# Patient Record
Sex: Female | Born: 1985
Health system: Southern US, Community
[De-identification: ages and names within clinical notes are randomized; demographics above are authoritative.]

## PROBLEM LIST (undated history)

## (undated) DIAGNOSIS — F419 Anxiety disorder, unspecified: Secondary | ICD-10-CM

## (undated) DIAGNOSIS — F319 Bipolar disorder, unspecified: Secondary | ICD-10-CM

## (undated) DIAGNOSIS — F329 Major depressive disorder, single episode, unspecified: Secondary | ICD-10-CM

## (undated) DIAGNOSIS — K859 Acute pancreatitis without necrosis or infection, unspecified: Secondary | ICD-10-CM

## (undated) DIAGNOSIS — F32A Depression, unspecified: Secondary | ICD-10-CM

## (undated) DIAGNOSIS — F1990 Other psychoactive substance use, unspecified, uncomplicated: Secondary | ICD-10-CM

## (undated) HISTORY — PX: BREAST SURGERY: SHX581

---

## 2017-03-08 ENCOUNTER — Encounter (HOSPITAL_COMMUNITY): Payer: Self-pay | Admitting: Emergency Medicine

## 2017-03-08 ENCOUNTER — Emergency Department (HOSPITAL_COMMUNITY)
Admission: EM | Admit: 2017-03-08 | Discharge: 2017-03-08 | Disposition: A | Discharge status: Home / Self Care | Payer: Medicare Other | Attending: Emergency Medicine | Admitting: Emergency Medicine

## 2017-03-08 DIAGNOSIS — F17200 Nicotine dependence, unspecified, uncomplicated: Secondary | ICD-10-CM | POA: Diagnosis not present

## 2017-03-08 DIAGNOSIS — Y9389 Activity, other specified: Secondary | ICD-10-CM | POA: Diagnosis not present

## 2017-03-08 DIAGNOSIS — S3992XA Unspecified injury of lower back, initial encounter: Secondary | ICD-10-CM | POA: Diagnosis present

## 2017-03-08 DIAGNOSIS — M545 Low back pain, unspecified: Secondary | ICD-10-CM

## 2017-03-08 DIAGNOSIS — Y999 Unspecified external cause status: Secondary | ICD-10-CM | POA: Insufficient documentation

## 2017-03-08 DIAGNOSIS — X509XXA Other and unspecified overexertion or strenuous movements or postures, initial encounter: Secondary | ICD-10-CM | POA: Diagnosis not present

## 2017-03-08 DIAGNOSIS — Y9289 Other specified places as the place of occurrence of the external cause: Secondary | ICD-10-CM | POA: Diagnosis not present

## 2017-03-08 LAB — URINALYSIS, ROUTINE W REFLEX MICROSCOPIC
Bacteria, UA: NONE SEEN
Bilirubin Urine: NEGATIVE
GLUCOSE, UA: NEGATIVE mg/dL
KETONES UR: NEGATIVE mg/dL
LEUKOCYTES UA: NEGATIVE
Nitrite: NEGATIVE
PH: 6 (ref 5.0–8.0)
Protein, ur: NEGATIVE mg/dL
Specific Gravity, Urine: 1 — ABNORMAL LOW (ref 1.005–1.030)

## 2017-03-08 LAB — POC URINE PREG, ED: Preg Test, Ur: NEGATIVE

## 2017-03-08 MED ORDER — METHOCARBAMOL 500 MG PO TABS
500.0000 mg | ORAL_TABLET | Freq: Once | ORAL | Status: AC
  Administered 2017-03-08: 500 mg via ORAL
  Filled 2017-03-08: qty 1

## 2017-03-08 MED ORDER — KETOROLAC TROMETHAMINE 60 MG/2ML IM SOLN
30.0000 mg | Freq: Once | INTRAMUSCULAR | Status: AC
  Administered 2017-03-08: 30 mg via INTRAMUSCULAR
  Filled 2017-03-08: qty 2

## 2017-03-08 MED ORDER — METHOCARBAMOL 500 MG PO TABS: 500.0000 mg | ORAL_TABLET | Freq: Two times a day (BID) | ORAL | 0 refills | Status: DC

## 2017-03-08 MED ORDER — DICLOFENAC SODIUM 75 MG PO TBEC: 75.0000 mg | DELAYED_RELEASE_TABLET | Freq: Two times a day (BID) | ORAL | 0 refills | Status: DC

## 2017-03-08 NOTE — ED Triage Notes (Signed)
p[t. Stated, I took out the trash yesterday at work and Im not sure if that caused it , but this morning I couildn't hardly get out of bed.

## 2017-03-08 NOTE — ED Notes (Signed)
Pt states "I just got my period".

## 2017-03-08 NOTE — ED Notes (Signed)
Pt informed that urine specimen is needed.  

## 2017-03-08 NOTE — ED Notes (Addendum)
Pt states she was emptying trash last pm, had sudden left lower back pain. Woke this am and "I couldn't walk". States has never had this problem before. Pt moaning with pain. Denies vaginal discharge.

## 2017-03-08 NOTE — ED Provider Notes (Signed)
[facility]-EMERGENCY DEPT Provider Note   CSN: [id number] Arrival date & time: 03/08/17  7829  By signing my name below, I, Majel Homer, attest that this documentation has been prepared under the direction and in the presence of non-physician practitioner, Ok Edwards, PA-C. Electronically Signed: Majel Homer, Scribe. 03/08/2017. 10:35 AM.  History   Chief Complaint Chief Complaint  Patient presents with  . Back Pain   The history is provided by the patient. No language interpreter was used.   HPI Comments: Emma Contreras is a 31 y.o. female with no pertinent PMHx, who presents to the Emergency Department complaining of gradually worsening, left lower back pain that began yesterday afternoon while at work. Pt reports she was "taking the trash out" yesterday when she suddenly felt something "pull" in her lower back. She states she awoke from sleep this morning and noticed she was unable to ambulate due to her pain. She denies any pain in her BLE, numbness or weakness in her extremities, and dysuria.   History reviewed. No pertinent past medical history.  There are no active problems to display for this patient.  History reviewed. No pertinent surgical history.  OB History    Gravida Para Term Preterm AB Living   0 0 0 0 0 0   SAB TAB Ectopic Multiple Live Births   0 0 0 0 0     Home Medications    Prior to Admission medications   Not on File   Family History No family history on file.  Social History Social History  Substance Use Topics  . Smoking status: Current Every Day Smoker  . Smokeless tobacco: Current User  . Alcohol use No   Allergies   Patient has no allergy information on record.  Review of Systems Review of Systems  Genitourinary: Negative for dysuria.  Musculoskeletal: Positive for back pain.  Neurological: Negative for weakness and numbness.   Physical Exam Updated Vital Signs BP 119/83 (BP Location: Right Arm)   Pulse (!) 106   Temp 97.8 F (36.6  C) (Oral)   Resp 20   Ht  (1.6 m)   Wt 172 lb 1 oz (78 kg)   SpO2 97%   BMI 30.48 kg/m   Physical Exam  Constitutional: She is oriented to person, place, and time. She appears well-developed and well-nourished.  HENT:  Head: Normocephalic.  Eyes: EOM are normal.  Neck: Normal range of motion.  Pulmonary/Chest: Effort normal.  Abdominal: She exhibits no distension.  Musculoskeletal: She exhibits tenderness.  Tenderness to the left lumbar spine/flank area.   Neurological: She is alert and oriented to person, place, and time.  Psychiatric: She has a normal mood and affect.  Nursing note and vitals reviewed.  ED Treatments / Results  DIAGNOSTIC STUDIES:  Oxygen Saturation is 97% on RA, normal by my interpretation.    COORDINATION OF CARE:  10:26 AM Discussed treatment plan with pt at bedside and pt agreed to plan.  Labs (all labs ordered are listed, but only abnormal results are displayed) Labs Reviewed - No data to display  EKG  EKG Interpretation None       Radiology No results found.  Procedures Procedures (including critical care time)  Medications Ordered in ED Medications - No data to display  Initial Impression / Assessment and Plan / ED Course  I have reviewed the triage vital signs and the nursing notes.  Pertinent labs & imaging results that were available during my care of the patient were  reviewed by me and considered in my medical decision making (see chart for details).     Patient with back pain.  No neurological deficits and normal neuro exam.  Patient is ambulatory.  No loss of bowel or bladder control.  No concern for cauda equina.  No fever, night sweats, weight loss, h/o cancer, IVDA, no recent procedure to back. No urinary symptoms suggestive of UTI.  Supportive care and return precaution discussed. Appears safe for discharge at this time. Follow up as indicated in discharge paperwork.     Final Clinical Impressions(s) / ED  Diagnoses   Final diagnoses:  Acute left-sided low back pain without sciatica    New Prescriptions New Prescriptions   DICLOFENAC (VOLTAREN) 75 MG EC TABLET    Take 1 tablet (75 mg total) by mouth 2 (two) times daily.   METHOCARBAMOL (ROBAXIN) 500 MG TABLET    Take 1 tablet (500 mg total) by mouth 2 (two) times daily.  An After Visit Summary was printed and given to the patient.  I personally performed the services in this documentation, which was scribed in my presence.  The recorded information has been reviewed and considered.   Barnet Pall.   Lonia Skinner [place], PA-C 03/08/17 1125    Doug Sou, MD 03/08/17 1718

## 2017-03-08 NOTE — ED Notes (Signed)
Up to bathroom

## 2017-04-20 ENCOUNTER — Emergency Department (HOSPITAL_BASED_OUTPATIENT_CLINIC_OR_DEPARTMENT_OTHER)
Admission: EM | Admit: 2017-04-20 | Discharge: 2017-04-20 | Disposition: A | Discharge status: Home / Self Care | Payer: Medicare Other | Attending: Emergency Medicine | Admitting: Emergency Medicine

## 2017-04-20 ENCOUNTER — Encounter (HOSPITAL_BASED_OUTPATIENT_CLINIC_OR_DEPARTMENT_OTHER): Payer: Self-pay | Admitting: Emergency Medicine

## 2017-04-20 DIAGNOSIS — K0500 Acute gingivitis, plaque induced: Secondary | ICD-10-CM | POA: Diagnosis not present

## 2017-04-20 DIAGNOSIS — F1410 Cocaine abuse, uncomplicated: Secondary | ICD-10-CM | POA: Diagnosis not present

## 2017-04-20 DIAGNOSIS — K0510 Chronic gingivitis, plaque induced: Secondary | ICD-10-CM

## 2017-04-20 DIAGNOSIS — Z79899 Other long term (current) drug therapy: Secondary | ICD-10-CM | POA: Insufficient documentation

## 2017-04-20 DIAGNOSIS — F17200 Nicotine dependence, unspecified, uncomplicated: Secondary | ICD-10-CM | POA: Insufficient documentation

## 2017-04-20 DIAGNOSIS — F1910 Other psychoactive substance abuse, uncomplicated: Secondary | ICD-10-CM | POA: Insufficient documentation

## 2017-04-20 DIAGNOSIS — M545 Low back pain, unspecified: Secondary | ICD-10-CM

## 2017-04-20 DIAGNOSIS — R454 Irritability and anger: Secondary | ICD-10-CM | POA: Diagnosis not present

## 2017-04-20 DIAGNOSIS — N390 Urinary tract infection, site not specified: Secondary | ICD-10-CM | POA: Diagnosis not present

## 2017-04-20 DIAGNOSIS — F1110 Opioid abuse, uncomplicated: Secondary | ICD-10-CM | POA: Diagnosis not present

## 2017-04-20 HISTORY — DX: Depression, unspecified: F32.A

## 2017-04-20 HISTORY — DX: Anxiety disorder, unspecified: F41.9

## 2017-04-20 HISTORY — DX: Major depressive disorder, single episode, unspecified: F32.9

## 2017-04-20 HISTORY — DX: Bipolar disorder, unspecified: F31.9

## 2017-04-20 HISTORY — DX: Acute pancreatitis without necrosis or infection, unspecified: K85.90

## 2017-04-20 LAB — URINALYSIS, ROUTINE W REFLEX MICROSCOPIC
Bilirubin Urine: NEGATIVE
Glucose, UA: NEGATIVE mg/dL
HGB URINE DIPSTICK: NEGATIVE
Ketones, ur: NEGATIVE mg/dL
Leukocytes, UA: NEGATIVE
NITRITE: NEGATIVE
PROTEIN: 30 mg/dL — AB
Specific Gravity, Urine: 1.035 — ABNORMAL HIGH (ref 1.005–1.030)
pH: 8 (ref 5.0–8.0)

## 2017-04-20 LAB — URINALYSIS, MICROSCOPIC (REFLEX): RBC / HPF: NONE SEEN RBC/hpf (ref 0–5)

## 2017-04-20 LAB — PREGNANCY, URINE: PREG TEST UR: NEGATIVE

## 2017-04-20 MED ORDER — CHLORHEXIDINE GLUCONATE 0.12 % MT SOLN: 15.0000 mL | Freq: Two times a day (BID) | OROMUCOSAL | 0 refills | Status: DC

## 2017-04-20 MED ORDER — CEPHALEXIN 500 MG PO CAPS: 500.0000 mg | ORAL_CAPSULE | Freq: Three times a day (TID) | ORAL | 0 refills | Status: DC

## 2017-04-20 MED ORDER — METHOCARBAMOL 500 MG PO TABS: 500.0000 mg | ORAL_TABLET | Freq: Two times a day (BID) | ORAL | 0 refills | Status: DC

## 2017-04-20 MED ORDER — METHOCARBAMOL 500 MG PO TABS
750.0000 mg | ORAL_TABLET | Freq: Once | ORAL | Status: AC
  Administered 2017-04-20: 750 mg via ORAL
  Filled 2017-04-20: qty 2

## 2017-04-20 MED ORDER — KETOROLAC TROMETHAMINE 60 MG/2ML IM SOLN
30.0000 mg | Freq: Once | INTRAMUSCULAR | Status: AC
  Administered 2017-04-20: 30 mg via INTRAMUSCULAR
  Filled 2017-04-20: qty 2

## 2017-04-20 NOTE — ED Notes (Signed)
Pt discharged to home NAD.  

## 2017-04-20 NOTE — ED Notes (Signed)
Pt still in restroom

## 2017-04-20 NOTE — ED Notes (Signed)
Pt not in the building or the parking lot.

## 2017-04-20 NOTE — Discharge Instructions (Signed)
Take keflex as prescribed until all gone for uti and gum disease. Take robaxin for muscle spasms. Use peridex mouth rinse for gum disease. Follow up with family doctor as needed. Stop using drugs.

## 2017-04-20 NOTE — ED Notes (Addendum)
1815 Nurse first reports that the patient has been in the lobby bathroom for a long time and is doing something besides using the bathroom.  CN knocked on door and unlocked the door.  The patient was standing next to the sink, holding something under her shirt.  Patient's shirt is wet and she is shaking.  Patient stated she takes a long time to use the bathroom, and walked over and put something in her purse.  Patient appears anxious and shaky.  There was an odor of BM and cigarette smoke.

## 2017-04-20 NOTE — ED Notes (Signed)
Pt given cup and told we needed more urine. PT states she can not urinate at this time.

## 2017-04-20 NOTE — ED Triage Notes (Signed)
Pt reports back, overheated, irritable and like she wants to punch something since she woke up this morning. Pt states she sleeps on the floor which may explain the back pain. Pt denies SI/HI.

## 2017-04-20 NOTE — ED Notes (Signed)
PT appeared in hallway from waiting area, states she was in the bathroom. ALL bathrooms were checked for this patient  And doors were open bathrooms were empty. PA notified

## 2017-04-20 NOTE — ED Notes (Signed)
Pt was also noted to have gone "out to her car to lock her glove compartment and get her money." Security and [employer] made aware of situation. Pt came back to lobby and was triaged, but stated she wanted to "go out and smoke" before being taken back to a room. Pt was later taken to ED3. States she is "really dehydrated and needs something to drink." Advised to wait until seen by EDP.

## 2017-04-20 NOTE — ED Notes (Signed)
Pt called for triage, no response. 

## 2017-04-20 NOTE — ED Provider Notes (Signed)
[facility] Provider Note   CSN: [id number] Arrival date & time: 04/20/17  1745   By signing my name below, I, Emma Contreras, attest that this documentation has been prepared under the direction and in the presence of [name], [employer] CorporationPA-C Electronically Signed: Soijett Contreras, ED Scribe. 04/20/17. 8:09 PM.  History   Chief Complaint Chief Complaint  Patient presents with  . Back Pain  . irritable    HPI Emma Contreras is a 31 y.o. female with a PMHx of bipolar 1 depression, anxiety, who presents to the Emergency Department complaining of non-radiating, left lower back pain onset 2 days ago. Pt reports associated HA. Pt has tried ibuprofen with no relief of her symptoms. Pt denies fever, nausea, vomiting, dysuria, frequency, and any other symptoms.   Pt secondarily complains of increased anxiety onset this morning. Pt has not tried any medications for the relief of her symptoms. She states that it feels as if she is "freaking out being in my own body." Pt notes that she was attempting to come into the ED earlier today but began to panic while in the car due to the drive taking too long. She notes that she used cocaine and heroine with her last usage being last night. Pt reports that she has had daily usage of cocaine and heroine for the past several days.  Pt has a psychiatrist that she sees regularly with her next appointment being tomorrow. Denies recent medication change. Pt denies SI, HI, and any other symptoms.   The history is provided by the patient. No language interpreter was used.    Past Medical History:  Diagnosis Date  . Anxiety   . Bipolar 1 disorder (HCC)   . Depression   . Pancreatitis     There are no active problems to display for this patient.   Past Surgical History:  Procedure Laterality Date  . BREAST SURGERY      OB History    Gravida Para Term Preterm AB Living   0 0 0 0 0 0   SAB TAB Ectopic Multiple Live Births   0 0 0 0 0        Home Medications    Prior to Admission medications   Medication Sig Start Date End Date Taking? Authorizing Provider  UNKNOWN TO PATIENT    Yes [provider]  diclofenac (VOLTAREN) 75 MG EC tablet Take 1 tablet (75 mg total) by mouth 2 (two) times daily. 03/08/17   Elson AreasSofia, Leslie K, PA-C  methocarbamol (ROBAXIN) 500 MG tablet Take 1 tablet (500 mg total) by mouth 2 (two) times daily. 03/08/17   Elson AreasSofia, Leslie K, PA-C    Family History No family history on file.  Social History Social History  Substance Use Topics  . Smoking status: Current Every Day Smoker  . Smokeless tobacco: Current User  . Alcohol use No     Allergies   Patient has no known allergies.   Review of Systems Review of Systems  Constitutional: Negative for fever.  Gastrointestinal: Negative for nausea and vomiting.  Genitourinary: Negative for dysuria and frequency.  Musculoskeletal: Positive for back pain (left lower).  Neurological: Positive for headaches.  Psychiatric/Behavioral: Negative for suicidal ideas. The patient is nervous/anxious.        No HI     Physical Exam Updated Vital Signs BP 118/60 (BP Location: Left Arm)   Pulse 78   Temp 98.9 F (37.2 C) (Oral)   Resp 18   Ht 5\' 3"  (  1.6 m)   Wt 160 lb (72.6 kg)   LMP 03/24/2017   SpO2 98%   BMI 28.34 kg/m   Physical Exam  Constitutional: She is oriented to person, place, and time. She appears well-developed and well-nourished. No distress.  Somnolent, appears to be sedated  HENT:  Head: Normocephalic and atraumatic.  Diffuse gingival irritation, tenderness to palpation over left lower central incisor  Eyes: EOM are normal.  Neck: Neck supple.  Cardiovascular: Normal rate, regular rhythm and normal heart sounds.   Pulmonary/Chest: Effort normal and breath sounds normal. No respiratory distress. She has no wheezes.  Abdominal: Soft. Bowel sounds are normal. She exhibits no mass. There is no tenderness. There is no  guarding.  No cva tenderness bilaterally  Musculoskeletal: Normal range of motion.  Tenderness to palpation over left lower paraspinal muscles. Full range of motion bilateral hips. No pain with straight leg raise bilaterally.  Neurological: She is alert and oriented to person, place, and time.  5/5 and equal lower extremity strength. 2+ and equal patellar reflexes bilaterally. Pt able to dorsiflex bilateral toes and feet with good strength against resistance. Equal sensation bilaterally over thighs and lower legs.   Skin: Skin is warm and dry.  Psychiatric: She has a normal mood and affect. Her behavior is normal.  Nursing note and vitals reviewed.    ED Treatments / Results  DIAGNOSTIC STUDIES: Oxygen Saturation is 98% on RA, nl by my interpretation.    COORDINATION OF CARE: 8:06 PM Discussed treatment plan with pt at bedside which includes UA and pt agreed to plan.   Labs (all labs ordered are listed, but only abnormal results are displayed) Labs Reviewed  URINALYSIS, ROUTINE W REFLEX MICROSCOPIC - Abnormal; Notable for the following:       Result Value   APPearance TURBID (*)    Specific Gravity, Urine 1.035 (*)    Protein, ur 30 (*)    All other components within normal limits  URINALYSIS, MICROSCOPIC (REFLEX) - Abnormal; Notable for the following:    Bacteria, UA MANY (*)    Squamous Epithelial / LPF 6-30 (*)    All other components within normal limits  PREGNANCY, URINE    EKG  EKG Interpretation None       Radiology No results found.  Procedures Procedures (including critical care time)  Medications Ordered in ED Medications - No data to display   Initial Impression / Assessment and Plan / ED Course  I have reviewed the triage vital signs and the nursing notes.  Pertinent labs & imaging results that were available during my care of the patient were reviewed by me and considered in my medical decision making (see chart for details).     Patient in  emergency department complaining of left lower back pain, also complaining of sensation of skin crawling and being anxious. Initially denied any drug use, however later admitted to doing cocaine and heroin daily. Last used last night. Patient admitted that she is worried she is going to withdraw. Also complaining of lower back pain which is recurrent, no concern for cauda equina based on exam and history. Will treat with Robaxin, NSAIDs. Toradol will be ordered for pain in emergency department.   9:17 PM Patient no her to be found, was seen walking out of the emergency department.   Patient apparently returned stating she was in the bathroom. Patient appears to be more awake, vital signs are normal. She'll be discharged home with Robaxin, NSAIDs. Advised to stop  using drugs. I will place her Keflex for her UTI and dental disease. Will also give prescription for Peridex.  Final Clinical Impressions(s) / ED Diagnoses   Final diagnoses:  Acute left-sided low back pain without sciatica  Urinary tract infection without hematuria, site unspecified  Gingivitis  Polysubstance abuse    New Prescriptions New Prescriptions   CEPHALEXIN (KEFLEX) 500 MG CAPSULE    Take 1 capsule (500 mg total) by mouth 3 (three) times daily.   CHLORHEXIDINE (PERIDEX) 0.12 % SOLUTION    Use as directed 15 mLs in the mouth or throat 2 (two) times daily.   METHOCARBAMOL (ROBAXIN) 500 MG TABLET    Take 1 tablet (500 mg total) by mouth 2 (two) times daily.   I personally performed the services described in this documentation, which was scribed in my presence. The recorded information has been reviewed and is accurate.    Jaynie Crumble, PA-C 04/20/17 2134    Maia Plan, MD 04/21/17 1036

## 2017-05-01 ENCOUNTER — Emergency Department (HOSPITAL_BASED_OUTPATIENT_CLINIC_OR_DEPARTMENT_OTHER): Payer: Medicare Other

## 2017-05-01 ENCOUNTER — Encounter (HOSPITAL_BASED_OUTPATIENT_CLINIC_OR_DEPARTMENT_OTHER): Payer: Self-pay | Admitting: Emergency Medicine

## 2017-05-01 ENCOUNTER — Observation Stay (HOSPITAL_BASED_OUTPATIENT_CLINIC_OR_DEPARTMENT_OTHER)
Admission: EM | Admit: 2017-05-01 | Discharge: 2017-05-01 | Discharge status: Against Medical Advice | Payer: Medicare Other | Attending: Internal Medicine | Admitting: Internal Medicine

## 2017-05-01 DIAGNOSIS — M79641 Pain in right hand: Secondary | ICD-10-CM | POA: Diagnosis present

## 2017-05-01 DIAGNOSIS — F1120 Opioid dependence, uncomplicated: Secondary | ICD-10-CM | POA: Insufficient documentation

## 2017-05-01 DIAGNOSIS — Z791 Long term (current) use of non-steroidal anti-inflammatories (NSAID): Secondary | ICD-10-CM | POA: Insufficient documentation

## 2017-05-01 DIAGNOSIS — F1990 Other psychoactive substance use, unspecified, uncomplicated: Secondary | ICD-10-CM

## 2017-05-01 DIAGNOSIS — Z79899 Other long term (current) drug therapy: Secondary | ICD-10-CM | POA: Diagnosis not present

## 2017-05-01 DIAGNOSIS — L03115 Cellulitis of right lower limb: Principal | ICD-10-CM | POA: Insufficient documentation

## 2017-05-01 DIAGNOSIS — L02511 Cutaneous abscess of right hand: Secondary | ICD-10-CM | POA: Diagnosis not present

## 2017-05-01 DIAGNOSIS — L02519 Cutaneous abscess of unspecified hand: Secondary | ICD-10-CM | POA: Diagnosis present

## 2017-05-01 DIAGNOSIS — F17210 Nicotine dependence, cigarettes, uncomplicated: Secondary | ICD-10-CM | POA: Diagnosis not present

## 2017-05-01 DIAGNOSIS — L03113 Cellulitis of right upper limb: Secondary | ICD-10-CM | POA: Insufficient documentation

## 2017-05-01 DIAGNOSIS — L03119 Cellulitis of unspecified part of limb: Secondary | ICD-10-CM

## 2017-05-01 HISTORY — DX: Other psychoactive substance use, unspecified, uncomplicated: F19.90

## 2017-05-01 LAB — CBC WITH DIFFERENTIAL/PLATELET
Basophils Absolute: 0 K/uL (ref 0.0–0.1)
Basophils Relative: 0 %
Eosinophils Absolute: 0 K/uL (ref 0.0–0.7)
Eosinophils Relative: 0 %
HCT: 39.2 % (ref 36.0–46.0)
Hemoglobin: 13.2 g/dL (ref 12.0–15.0)
Lymphocytes Relative: 16 %
Lymphs Abs: 1.7 K/uL (ref 0.7–4.0)
MCH: 29.7 pg (ref 26.0–34.0)
MCHC: 33.7 g/dL (ref 30.0–36.0)
MCV: 88.1 fL (ref 78.0–100.0)
Monocytes Absolute: 0.9 K/uL (ref 0.1–1.0)
Monocytes Relative: 9 %
Neutro Abs: 7.9 K/uL — ABNORMAL HIGH (ref 1.7–7.7)
Neutrophils Relative %: 75 %
Platelets: 230 K/uL (ref 150–400)
RBC: 4.45 MIL/uL (ref 3.87–5.11)
RDW: 14 % (ref 11.5–15.5)
WBC: 10.5 K/uL (ref 4.0–10.5)

## 2017-05-01 LAB — COMPREHENSIVE METABOLIC PANEL WITH GFR
ALT: 44 U/L (ref 14–54)
AST: 28 U/L (ref 15–41)
Albumin: 3.6 g/dL (ref 3.5–5.0)
Alkaline Phosphatase: 51 U/L (ref 38–126)
Anion gap: 9 (ref 5–15)
BUN: 11 mg/dL (ref 6–20)
CO2: 26 mmol/L (ref 22–32)
Calcium: 9.1 mg/dL (ref 8.9–10.3)
Chloride: 99 mmol/L — ABNORMAL LOW (ref 101–111)
Creatinine, Ser: 0.76 mg/dL (ref 0.44–1.00)
GFR calc Af Amer: >60 mL/min
GFR calc non Af Amer: >60 mL/min
Glucose, Bld: 109 mg/dL — ABNORMAL HIGH (ref 65–99)
Potassium: 3.7 mmol/L (ref 3.5–5.1)
Sodium: 134 mmol/L — ABNORMAL LOW (ref 135–145)
Total Bilirubin: 0.7 mg/dL (ref 0.3–1.2)
Total Protein: 7 g/dL (ref 6.5–8.1)

## 2017-05-01 LAB — PREGNANCY, URINE: Preg Test, Ur: NEGATIVE

## 2017-05-01 MED ORDER — LIDOCAINE-EPINEPHRINE-TETRACAINE (LET) SOLUTION
3.0000 mL | Freq: Once | NASAL | Status: AC
  Administered 2017-05-01: 3 mL via TOPICAL
  Filled 2017-05-01: qty 3

## 2017-05-01 MED ORDER — SULFAMETHOXAZOLE-TRIMETHOPRIM 800-160 MG PO TABS: 1.0000 | ORAL_TABLET | Freq: Two times a day (BID) | ORAL | 0 refills | Status: AC

## 2017-05-01 MED ORDER — SULFAMETHOXAZOLE-TRIMETHOPRIM 800-160 MG PO TABS
1.0000 | ORAL_TABLET | Freq: Once | ORAL | Status: AC
  Administered 2017-05-01: 1 via ORAL
  Filled 2017-05-01: qty 1

## 2017-05-01 MED ORDER — SODIUM CHLORIDE 0.9 % IV BOLUS (SEPSIS)
1000.0000 mL | Freq: Once | INTRAVENOUS | Status: AC
  Administered 2017-05-01: 1000 mL via INTRAVENOUS

## 2017-05-01 MED ORDER — KETOROLAC TROMETHAMINE 15 MG/ML IJ SOLN
15.0000 mg | Freq: Once | INTRAMUSCULAR | Status: AC
  Administered 2017-05-01: 15 mg via INTRAVENOUS
  Filled 2017-05-01: qty 1

## 2017-05-01 MED ORDER — CEPHALEXIN 250 MG PO CAPS
1000.0000 mg | ORAL_CAPSULE | Freq: Once | ORAL | Status: AC
  Administered 2017-05-01: 1000 mg via ORAL
  Filled 2017-05-01: qty 4

## 2017-05-01 MED ORDER — MORPHINE SULFATE (PF) 4 MG/ML IV SOLN
4.0000 mg | Freq: Once | INTRAVENOUS | Status: AC
  Administered 2017-05-01: 4 mg via INTRAVENOUS
  Filled 2017-05-01: qty 1

## 2017-05-01 MED ORDER — CEPHALEXIN 500 MG PO CAPS: 500.0000 mg | ORAL_CAPSULE | Freq: Four times a day (QID) | ORAL | 0 refills | Status: DC

## 2017-05-01 MED ORDER — ONDANSETRON HCL 4 MG/2ML IJ SOLN
4.0000 mg | Freq: Once | INTRAMUSCULAR | Status: AC
  Administered 2017-05-01: 4 mg via INTRAVENOUS
  Filled 2017-05-01: qty 2

## 2017-05-01 MED ORDER — LIDOCAINE HCL (PF) 1 % IJ SOLN
5.0000 mL | Freq: Once | INTRAMUSCULAR | Status: AC
  Administered 2017-05-01: 5 mL via INTRADERMAL
  Filled 2017-05-01: qty 5

## 2017-05-01 NOTE — ED Provider Notes (Signed)
I was called to the room by nursing at the time [facility] because of IVDA and cellulitis with presumed concern for infected joint, bacteremia, or other serious cause for her symptoms. She was being admitted for IV antibiotics and further workup. She refused transport without a guaranteed way to get back. I discussed with her that it was likely they would have a way for her to get home but she did not comply with that. I tried to get her to call friends in the area and coworkers to help find a ride home from the hospital when she was discharged, all without success. Nursing tried the same. She wanted to leave.  She was afebrile, normal vital signs, in her right mind and competent to make decisions. She knew the risks of worsening symptoms, bacteremia, death and still wanted to leave. The second best option was for her to drive herself and she did not want to do that either. In lieu of admission I felt that the next best thing was outpatient follow up with PO antibiotics which were started in the emergency department and will be continued at home. She knows she can return at any time for reevaluation and reconsideration for admission.  She was discharged against medical advice.    Marily MemosMesner, Jazlin Tapscott, MD 05/01/17 [phone]
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
## 2017-05-01 NOTE — ED Triage Notes (Signed)
Pt c/o RT hand pain and swelling x 1 wk s/p injecting heroin; also c/o RT foot pain and swelling x 1 wk w/ no known injury

## 2017-05-01 NOTE — ED Provider Notes (Signed)
[facility] Provider Note   CSN: [id number] Arrival date & time: 05/01/17  1035     History   Chief Complaint Chief Complaint  Patient presents with  . Hand Pain  . Foot Pain    HPI   Blood pressure 118/75, pulse 79, temperature 99.4 F (37.4 C), temperature source Oral, resp. rate 16, height 5\' 3"  (1.6 m), weight 72.6 kg (160 lb), last menstrual period 03/22/2017, SpO2 98 %.  Emma Contreras is a 31 y.o. female complaining of Pain and swelling to right hand side of which approximately 1 week ago. She reports fever (MAXIMUM TEMPERATURE 99.9 yesterday. She states that she hasn't injected into the hand in 1 week that she last used heroin yesterday. States that she is right-hand dominant. She endorses nausea, vomiting, generalized fatigue. She also reports pain and swelling to the right foot. She denies any trauma and states that she does not inject into the foot. States that she only uses heroin, she doesn't use any other pills that are injected into the skin.  Any other drugs besides heroin.   Past Medical History:  Diagnosis Date  . Anxiety   . Bipolar 1 disorder (HCC)   . Depression   . IV drug user   . Pancreatitis     Patient Active Problem List   Diagnosis Date Noted  . Cellulitis and abscess of hand 05/01/2017    Past Surgical History:  Procedure Laterality Date  . BREAST SURGERY      OB History    Gravida Para Term Preterm AB Living   0 0 0 0 0 0   SAB TAB Ectopic Multiple Live Births   0 0 0 0 0       Home Medications    Prior to Admission medications   Medication Sig Start Date End Date Taking? Authorizing Provider  cephALEXin (KEFLEX) 500 MG capsule Take 1 capsule (500 mg total) by mouth 3 (three) times daily. 04/20/17   Kirichenko, Tatyana, PA-C  chlorhexidine (PERIDEX) 0.12 % solution Use as directed 15 mLs in the mouth or throat 2 (two) times daily. 04/20/17   Kirichenko, Lemont Fillersatyana, PA-C  diclofenac (VOLTAREN) 75 MG EC tablet Take 1 tablet  (75 mg total) by mouth 2 (two) times daily. 03/08/17   Elson AreasSofia, Leslie K, PA-C  methocarbamol (ROBAXIN) 500 MG tablet Take 1 tablet (500 mg total) by mouth 2 (two) times daily. 04/20/17   Kirichenko, Lemont Fillersatyana, PA-C  UNKNOWN TO PATIENT     [provider]    Family History No family history on file.  Social History Social History  Substance Use Topics  . Smoking status: Current Every Day Smoker    Packs/day: 1.00  . Smokeless tobacco: Current User  . Alcohol use No     Allergies   Patient has no known allergies.   Review of Systems Review of Systems  A complete review of systems was obtained and all systems are negative except as noted in the HPI and PMH.    Physical Exam Updated Vital Signs BP 113/70 (BP Location: Right Arm)   Pulse 72   Temp 99.4 F (37.4 C) (Oral)   Resp 18   Ht 5\' 3"  (1.6 m)   Wt 72.6 kg (160 lb)   LMP 03/22/2017   SpO2 97%   BMI 28.34 kg/m   Physical Exam  Constitutional: She is oriented to person, place, and time. She appears well-developed and well-nourished. No distress.  Nontoxic appearing  HENT:  Head: Normocephalic  and atraumatic.  Mouth/Throat: Oropharynx is clear and moist.  Eyes: Conjunctivae and EOM are normal. Pupils are equal, round, and reactive to light.  Neck: Normal range of motion.  Cardiovascular: Normal rate, regular rhythm and intact distal pulses.   No murmur heard. Pulmonary/Chest: Effort normal and breath sounds normal.  Abdominal: Soft. There is no tenderness.  Musculoskeletal: Normal range of motion. She exhibits edema.  Significant swelling to dorsum of the right hand with track marks no focal fluctuance patient is distally neurovascularly intact and move her fingers. There is no lymphedema or streaking up the arms. There is a small area of erythema and induration on the right foot as diagrammed. No trauma to the skin.   Neurological: She is alert and oriented to person, place, and time. No sensory deficit.    Skin: She is not diaphoretic.  Psychiatric: She has a normal mood and affect.  Nursing note and vitals reviewed.            ED Treatments / Results  Labs (all labs ordered are listed, but only abnormal results are displayed) Labs Reviewed  CBC WITH DIFFERENTIAL/PLATELET - Abnormal; Notable for the following:       Result Value   Neutro Abs 7.9 (*)    All other components within normal limits  COMPREHENSIVE METABOLIC PANEL - Abnormal; Notable for the following:    Sodium 134 (*)    Chloride 99 (*)    Glucose, Bld 109 (*)    All other components within normal limits  CULTURE, BLOOD (ROUTINE X 2)  CULTURE, BLOOD (ROUTINE X 2)  PREGNANCY, URINE  RPR  HIV ANTIBODY (ROUTINE TESTING)    EKG  EKG Interpretation None       Radiology Dg Hand Complete Right  Result Date: 05/01/2017 CLINICAL DATA:  Right hand pain and swelling for the past month after injecting heroin. EXAM: RIGHT HAND - COMPLETE 3+ VIEW COMPARISON:  None in PACs FINDINGS: The bones are subjectively adequately mineralized. The joint spaces are well maintained. There is no acute or healing fracture nor lytic or blastic lesion. The soft tissues exhibit no foreign bodies. There is considerable soft tissue swelling over the meta carpal region. IMPRESSION: No objective evidence of osteomyelitis. Soft tissue swelling likely reflects cellulitis. Electronically Signed   By: David  [place] M.D.   On: 05/01/2017 11:29   Dg Foot Complete Right  Result Date: 05/01/2017 CLINICAL DATA:  Right foot pain and swelling for the past week with no known injury. EXAM: RIGHT FOOT COMPLETE - 3+ VIEW COMPARISON:  None in PACs FINDINGS: The bones of the foot are subjectively adequately mineralized. The joint spaces are well maintained. There is no acute or healing fracture. There is no lytic or blastic bony lesion. There is a plantar calcaneal spur. There is soft tissue swelling over the dorsum of the midfoot. No foreign bodies are  observed. IMPRESSION: Soft tissue swelling over the midfoot dorsally. No underlying bony abnormality is observed. Electronically Signed   By: David  [place] M.D.   On: 05/01/2017 11:30    Procedures .Marland KitchenIncision and Drainage Date/Time: 05/01/2017 1:27 PM Performed by: Wynetta Emery Authorized by: Wynetta Emery   Consent:    Consent obtained:  Verbal   Consent given by:  Patient   Risks discussed:  Damage to other organs and pain   Alternatives discussed:  Delayed treatment and no treatment Pre-procedure details:    Skin preparation:  Betadine Anesthesia (see MAR for exact dosages):    Anesthesia method:  Topical application   Topical anesthetic:  LET Procedure type:    Complexity:  Simple Procedure details:    Needle aspiration: no     Incision types:  Single straight   Scalpel blade:  11   Drainage:  Bloody   Drainage amount:  Scant   Wound treatment:  Wound left open   Packing materials:  None Post-procedure details:    Patient tolerance of procedure:  Tolerated well, no immediate complications   (including critical care time)  EMERGENCY DEPARTMENT US SOFT TISSUE INTERPRETATION "Study: Limited Soft Tissue Ultrasound"  INDICATIONS: Soft tissue infection Multiple views of the body part were obtained in real-time with a multi-frequency linear probe  PERFORMED BY: Myself IMAGES ARCHIVED?: Yes SIDE:Right  BODY PART:Neck INTERPRETATION:  Abcess present     Medications Ordered in ED Medications  ketorolac (TORADOL) 15 MG/ML injection 15 mg (not administered)  sodium chloride 0.9 % bolus 1,000 mL (0 mLs Intravenous Stopped 05/01/17 1356)  morphine 4 MG/ML injection 4 mg (4 mg Intravenous Given 05/01/17 1236)  ondansetron (ZOFRAN) injection 4 mg (4 mg Intravenous Given 05/01/17 1235)  lidocaine (PF) (XYLOCAINE) 1 % injection 5 mL (5 mLs Intradermal Given by Other 05/01/17 1239)  lidocaine-EPINEPHrine-tetracaine (LET) solution (3 mLs Topical Given 05/01/17 1217)      Initial Impression / Assessment and Plan / ED Course  I have reviewed the triage vital signs and the nursing notes.  Pertinent labs & imaging results that were available during my care of the patient were reviewed by me and considered in my medical decision making (see chart for details).     Vitals:   05/01/17 1041 05/01/17 1042 05/01/17 1351  BP: 118/75  113/70  Pulse: 79  72  Resp: 16  18  Temp: 99.4 F (37.4 C)    TempSrc: Oral    SpO2: 98%  97%  Weight:  72.6 kg (160 lb)   Height:  5\' 3"  (1.6 m)     Medications  ketorolac (TORADOL) 15 MG/ML injection 15 mg (not administered)  sodium chloride 0.9 % bolus 1,000 mL (0 mLs Intravenous Stopped 05/01/17 1356)  morphine 4 MG/ML injection 4 mg (4 mg Intravenous Given 05/01/17 1236)  ondansetron (ZOFRAN) injection 4 mg (4 mg Intravenous Given 05/01/17 1235)  lidocaine (PF) (XYLOCAINE) 1 % injection 5 mL (5 mLs Intradermal Given by Other 05/01/17 1239)  lidocaine-EPINEPHrine-tetracaine (LET) solution (3 mLs Topical Given 05/01/17 1217)    Emma Contreras is 31 y.o. female presenting with Pain and swelling to right hand after injecting into it 1 week ago. She reports a low-grade fever. She also has area of cellulitis on the right foot. She states that she does not inject into this area. Patient with temperature of 99 4 orally in the ED. No significant tachycardia, no murmurs. Bedside ultrasound with possible drainable abscess. Plain films with no foreign bodies.  Orthopedic consult from PA Dale Clarke appreciated: He has discussed the case with Dr. Roda Shutters who agrees with ID and hand follow-up.  IND attempted however I could not produce any purulent drainage. Patient is amenable to admission, she can be given IV antibiotics and reassess by hand surgery. Blood work reassuring with no significant leukocytosis. I am concerned that this patient is seeding bacteria because of the cellulitis on the foot with no inciting trauma.  Blood cultures  drawn, patient given vancomycin. Discussed with triad hospitalist Dr. Arthor Captain who accepts transfer to [facility] for admission.   Final Clinical Impressions(s) / ED Diagnoses  Final diagnoses:  IV drug user  Cellulitis of hand, right  Cellulitis of right foot    New Prescriptions New Prescriptions   No medications on file     Kaylyn Lim 05/01/17 1430    Geoffery Lyons, MD 05/01/17 1504

## 2017-05-01 NOTE — ED Notes (Signed)
Pt is refusing admission d/t she thinks she will not be able to get a ride back to this facility after [facility] is [facility] (her care [facility] here).

## 2017-05-01 NOTE — ED Notes (Signed)
EDP has discussed risks/benefits of leaving with pt. She continues to want to leave AMA.

## 2017-05-01 NOTE — ED Notes (Signed)
Patient transported to X-ray 

## 2017-05-01 NOTE — ED Notes (Signed)
Pt sleeping; resp even/unalbored.

## 2017-05-01 NOTE — ED Notes (Signed)
PA at bedside.

## 2017-05-02 LAB — HIV ANTIBODY (ROUTINE TESTING W REFLEX): HIV Screen 4th Generation wRfx: NONREACTIVE

## 2017-05-02 LAB — RPR: RPR Ser Ql: NONREACTIVE

## 2017-05-06 LAB — CULTURE, BLOOD (ROUTINE X 2)
CULTURE: NO GROWTH
Culture: NO GROWTH
Special Requests: ADEQUATE
Special Requests: ADEQUATE

## 2017-06-03 ENCOUNTER — Encounter (HOSPITAL_BASED_OUTPATIENT_CLINIC_OR_DEPARTMENT_OTHER): Payer: Self-pay

## 2017-06-03 ENCOUNTER — Emergency Department (HOSPITAL_BASED_OUTPATIENT_CLINIC_OR_DEPARTMENT_OTHER): Payer: Medicare Other

## 2017-06-03 ENCOUNTER — Emergency Department (HOSPITAL_BASED_OUTPATIENT_CLINIC_OR_DEPARTMENT_OTHER)
Admission: EM | Admit: 2017-06-03 | Discharge: 2017-06-03 | Disposition: A | Discharge status: Home / Self Care | Payer: Medicare Other | Attending: Emergency Medicine | Admitting: Emergency Medicine

## 2017-06-03 DIAGNOSIS — M542 Cervicalgia: Secondary | ICD-10-CM | POA: Diagnosis not present

## 2017-06-03 DIAGNOSIS — R202 Paresthesia of skin: Secondary | ICD-10-CM | POA: Diagnosis not present

## 2017-06-03 DIAGNOSIS — M549 Dorsalgia, unspecified: Secondary | ICD-10-CM | POA: Diagnosis not present

## 2017-06-03 DIAGNOSIS — M791 Myalgia: Secondary | ICD-10-CM | POA: Insufficient documentation

## 2017-06-03 DIAGNOSIS — Y92481 Parking lot as the place of occurrence of the external cause: Secondary | ICD-10-CM | POA: Diagnosis not present

## 2017-06-03 DIAGNOSIS — F17210 Nicotine dependence, cigarettes, uncomplicated: Secondary | ICD-10-CM | POA: Insufficient documentation

## 2017-06-03 DIAGNOSIS — Y93I9 Activity, other involving external motion: Secondary | ICD-10-CM | POA: Diagnosis not present

## 2017-06-03 DIAGNOSIS — M7918 Myalgia, other site: Secondary | ICD-10-CM

## 2017-06-03 DIAGNOSIS — Y999 Unspecified external cause status: Secondary | ICD-10-CM | POA: Diagnosis not present

## 2017-06-03 LAB — PREGNANCY, URINE: Preg Test, Ur: NEGATIVE

## 2017-06-03 MED ORDER — ORPHENADRINE CITRATE ER 100 MG PO TB12: 100.0000 mg | ORAL_TABLET | Freq: Two times a day (BID) | ORAL | 0 refills | Status: AC | PRN

## 2017-06-03 MED FILL — ORPHENADRINE 100 MG TAB SA: 100 | 6 days supply | Qty: 12 | Fill #0

## 2017-06-03 NOTE — Discharge Instructions (Signed)
Read the information below.  Use the prescribed medication as directed.  Please discuss all new medications with your pharmacist.  You may return to the Emergency Department at any time for worsening condition or any new symptoms that concern you.     Use ice or cold compresses, massage, gentle stretching over the next few days while you heal.  Follow up with primary care next week if you continue to have pain.   If you develop fevers, loss of control of bowel or bladder, weakness or numbness in your legs, or are unable to walk, return to the ER for a recheck.
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
## 2017-06-03 NOTE — ED Notes (Signed)
ED Provider at bedside. 

## 2017-06-20 ENCOUNTER — Emergency Department (HOSPITAL_BASED_OUTPATIENT_CLINIC_OR_DEPARTMENT_OTHER)
Admission: EM | Admit: 2017-06-20 | Discharge: 2017-06-21 | Disposition: A | Discharge status: Home / Self Care | Payer: Medicare Other | Attending: Emergency Medicine | Admitting: Emergency Medicine

## 2017-06-20 ENCOUNTER — Encounter (HOSPITAL_BASED_OUTPATIENT_CLINIC_OR_DEPARTMENT_OTHER): Payer: Self-pay | Admitting: *Deleted

## 2017-06-20 DIAGNOSIS — M545 Low back pain, unspecified: Secondary | ICD-10-CM

## 2017-06-20 DIAGNOSIS — F419 Anxiety disorder, unspecified: Secondary | ICD-10-CM | POA: Insufficient documentation

## 2017-06-20 DIAGNOSIS — F17210 Nicotine dependence, cigarettes, uncomplicated: Secondary | ICD-10-CM | POA: Diagnosis not present

## 2017-06-20 DIAGNOSIS — R112 Nausea with vomiting, unspecified: Secondary | ICD-10-CM | POA: Diagnosis not present

## 2017-06-20 LAB — URINALYSIS, ROUTINE W REFLEX MICROSCOPIC
Bilirubin Urine: NEGATIVE
Glucose, UA: NEGATIVE mg/dL
Hgb urine dipstick: NEGATIVE
Ketones, ur: NEGATIVE mg/dL
LEUKOCYTES UA: NEGATIVE
NITRITE: NEGATIVE
PROTEIN: NEGATIVE mg/dL
Specific Gravity, Urine: 1.008 (ref 1.005–1.030)
pH: 7 (ref 5.0–8.0)

## 2017-06-20 LAB — PREGNANCY, URINE: PREG TEST UR: NEGATIVE

## 2017-06-20 MED ORDER — IBUPROFEN 400 MG PO TABS: 600.0000 mg | ORAL_TABLET | Freq: Once | ORAL | Status: DC

## 2017-06-20 MED ORDER — ONDANSETRON 8 MG PO TBDP
8.0000 mg | ORAL_TABLET | Freq: Once | ORAL | Status: AC
Start: 2017-06-20 — End: 2017-06-20
  Administered 2017-06-20: 8 mg via ORAL
  Filled 2017-06-20: qty 1

## 2017-06-20 MED ORDER — ACETAMINOPHEN 500 MG PO TABS: 1000.0000 mg | ORAL_TABLET | Freq: Once | ORAL | Status: DC

## 2017-06-20 MED ORDER — METHOCARBAMOL 500 MG PO TABS
500.0000 mg | ORAL_TABLET | Freq: Once | ORAL | Status: AC
  Administered 2017-06-20: 500 mg via ORAL
  Filled 2017-06-20: qty 1
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
## 2017-06-21 MED ORDER — METHOCARBAMOL 500 MG PO TABS: 500.0000 mg | ORAL_TABLET | Freq: Three times a day (TID) | ORAL | 0 refills | Status: AC | PRN

## 2017-06-21 MED ORDER — IBUPROFEN 400 MG PO TABS: 400.0000 mg | ORAL_TABLET | Freq: Three times a day (TID) | ORAL | 0 refills | Status: AC | PRN

## 2017-06-21 MED ORDER — ONDANSETRON 8 MG PO TBDP: 8.0000 mg | ORAL_TABLET | Freq: Three times a day (TID) | ORAL | 0 refills | Status: AC | PRN
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
